# Patient Record
Sex: Male | Born: 1990 | Race: White | Hispanic: No | Marital: Single | Smoking: Current every day smoker
Health system: Southern US, Community
[De-identification: ages and names within clinical notes are randomized; demographics above are authoritative.]

## PROBLEM LIST (undated history)

## (undated) DIAGNOSIS — F410 Panic disorder [episodic paroxysmal anxiety] without agoraphobia: Secondary | ICD-10-CM

## (undated) DIAGNOSIS — R011 Cardiac murmur, unspecified: Secondary | ICD-10-CM

## (undated) HISTORY — PX: MYRINGOTOMY WITH TUBE PLACEMENT: SHX5663

---
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
## 2015-02-08 ENCOUNTER — Emergency Department (HOSPITAL_COMMUNITY)
Admission: EM | Admit: 2015-02-08 | Discharge: 2015-02-08 | Disposition: A | Discharge status: Home / Self Care | Payer: Self-pay | Attending: Emergency Medicine | Admitting: Emergency Medicine

## 2015-02-08 ENCOUNTER — Encounter (HOSPITAL_COMMUNITY): Payer: Self-pay | Admitting: Emergency Medicine

## 2015-02-08 DIAGNOSIS — R011 Cardiac murmur, unspecified: Secondary | ICD-10-CM | POA: Insufficient documentation

## 2015-02-08 DIAGNOSIS — Z720 Tobacco use: Secondary | ICD-10-CM | POA: Insufficient documentation

## 2015-02-08 DIAGNOSIS — N4340 Spermatocele of epididymis, unspecified: Secondary | ICD-10-CM | POA: Insufficient documentation

## 2015-02-08 HISTORY — DX: Cardiac murmur, unspecified: R01.1

## 2015-02-08 NOTE — Discharge Instructions (Signed)
°  °  The swelling you are feeling above the left testicle is most likely a spermatocele. There is a similar one above the right testicle. It may also be a hydrocele or varicocele. If you are concerned about the pain, or want to get further evaluation, see the urologist, mentioned in this paperwork.    Scrotal Masses Scrotal swelling is common in men of all ages. Common types of testicular masses include:   Hydrocele. The most common benign testicular mass in an adult. Hydroceles are generally soft and painless collections of fluid in the scrotal sac. These can rapidly change size as the fluid enters or leaves. Hydroceles can be associated with an underlying cancer of the testicle.  Spermatoceles. Generally soft and painless cyst-like masses in the scrotum that contain fluid, usually above the testicle. They can rapidly change size as the fluid enters or leaves. They are more prominent while standing or exercising. Sometimes, spermatoceles may cause a sensation of heaviness or a dull ache.  Orchitis. Inflammation of the testicle. It is painful and may be associated with a fever or symptoms of a urinary tract infection, including frequent and painful urination. It is common in males who have the mumps.  Varicocele. An enlargement of the veins that drain the testicles. Varicoceles usually occur on the left side of the scrotum. This condition can increase the risk of infertility. Varicocele is sometimes more prominent while standing or exercising. Sometimes, varicoceles may cause a sensation of heaviness or a dull ache.  Inguinal hernia. A bulge caused by a portion of intestine protruding into the scrotum through a weak area in the abdominal muscles. Hernias may or may not be painful. They are soft and usually enlarge with coughing or straining.  Torsion of the testis. This can cause a testicular mass that develops quickly and is associated with tenderness or fever, or both. It is caused by a  twisting of the testicle within the sac. It also reduces the blood supply and can destroy the testis if not treated quickly with surgery.  Epididymitis. Inflammation of the epididymis (a structure attached above and behind the testicle), usually caused by a urinary tract infection or a sexually transmitted infection. This generally shows up as testicular discomfort and swelling and may include pain during urination. It is frequently associated with a testicle infection.  Testicular appendages. Remnants of tissue on the testis present since birth. A testicular appendage can twist on its blood supply and cause pain. In most cases, this is seen as a blue dot on the scrotum.  Hematocele. A collection of blood between the layers of the sac inside the scrotum. It usually is caused by trauma to the scrotum.  Sebaceous cysts. These can be a swelling in the skin of the scrotum and are usually painless.  Cancer (carcinoma) of the skin of the scrotum. It can cause scrotal swelling, but this is rare. Document Released: 02/01/2003 Document Revised: 03/30/2013 Document Reviewed: 01/17/2013 [facility] Patient Information 2015 [employer] [place], [place]. This information is not intended to replace advice given to you by your health care provider. Make sure you discuss any questions you have with your health care provider.

## 2015-02-08 NOTE — ED Notes (Signed)
Pt. reports left testicular lump onset 2 years ago , denies injury , no dysuria or fever .

## 2015-02-08 NOTE — ED Provider Notes (Signed)
CSN: [id number]     Arrival date & time 02/08/15  2132 History   First MD Initiated Contact with Patient 02/08/15 2216     Chief Complaint  Patient presents with  . Mass     (Consider location/radiation/quality/duration/timing/severity/associated sxs/prior Treatment) HPI   Christian Lee is a 24 y.o. male presents for evaluation of a lump that has been present for 2 years on his left testicle. Concern was raised today, when he had a physical for a job, and the physician recommended that he get checked out by a primary care provider. Since he does not have a primary care physician, he decided to come here. He denies nausea, vomiting, fever, chills, dysuria, weakness or paresthesias. Has had no weight loss. There are no other known modifying factors.   Past Medical History  Diagnosis Date  . Heart murmur    Past Surgical History  Procedure Laterality Date  . Myringotomy with tube placement     No family history on file. History  Substance Use Topics  . Smoking status: Current Every Day Smoker  . Smokeless tobacco: Not on file  . Alcohol Use: Yes    Review of Systems  All other systems reviewed and are negative.     Allergies  Review of patient's allergies indicates no known allergies.  Home Medications   Prior to Admission medications   Not on File   BP 137/87 mmHg  Pulse 106  Temp(Src) 99.1 F (37.3 C) (Oral)  Resp 14  Ht 5\' 7"  (1.702 m)  Wt 123 lb (55.792 kg)  BMI 19.26 kg/m2  SpO2 100% Physical Exam  Constitutional: He is oriented to person, place, and time. He appears well-developed and well-nourished. No distress.  HENT:  Head: Normocephalic and atraumatic.  Right Ear: External ear normal.  Left Ear: External ear normal.  Eyes: Conjunctivae and EOM are normal. Pupils are equal, round, and reactive to light.  Neck: Normal range of motion and phonation normal. Neck supple.  Cardiovascular: Normal rate, regular rhythm and normal heart sounds.    Pulmonary/Chest: Effort normal and breath sounds normal. He exhibits no bony tenderness.  Abdominal: Soft. There is no tenderness.  Genitourinary:  Normal penis. No urethral discharge. No inguinal adenopathy. Normal-appearing scrotum. Normal testes, bilaterally. There are small cystic masses posteriorly above the testes, which feels separate from the testes. These  likel represents spermatoceles. The left one is slightly tender. The right one is not tender.  Musculoskeletal: Normal range of motion.  Neurological: He is alert and oriented to person, place, and time. No cranial nerve deficit or sensory deficit. He exhibits normal muscle tone. Coordination normal.  Skin: Skin is warm, dry and intact.  Psychiatric: He has a normal mood and affect. His behavior is normal. Judgment and thought content normal.  Nursing note and vitals reviewed.   ED Course  Procedures (including critical care time) Labs Review Labs Reviewed - No data to display  Imaging Review No results found.   EKG Interpretation None      MDM   Final diagnoses:  Spermatocele    Scrotal tenderness with mass likely secondary to spermatocele. Possibility that this is a hydrocele or varicocele. I doubt testicular cancer because he has had the mass for 2 years. Mass is mildly tender. No urinary tract symptoms.  Nursing Notes Reviewed/ Care Coordinated Applicable Imaging Reviewed Interpretation of Laboratory Data incorporated into ED treatment  The patient appears reasonably screened and/or stabilized for discharge and I doubt any other medical condition or  other EMC requiring further screening, evaluation, or treatment in the ED at this time prior to discharge.  Plan: Home Medications- OTC analgesia; Home Treatments- rest; return here if the recommended treatment, does not improve the symptoms; Recommended follow up- PCP prn     Mancel Bale, MD 02/08/15 2303

## 2015-02-08 NOTE — ED Notes (Signed)
EDP at bedside  

## 2017-07-31 ENCOUNTER — Encounter (HOSPITAL_COMMUNITY): Payer: Self-pay | Admitting: Emergency Medicine

## 2017-07-31 ENCOUNTER — Emergency Department (HOSPITAL_COMMUNITY): Payer: Self-pay

## 2017-07-31 ENCOUNTER — Other Ambulatory Visit: Payer: Self-pay

## 2017-07-31 ENCOUNTER — Emergency Department (HOSPITAL_COMMUNITY)
Admission: EM | Admit: 2017-07-31 | Discharge: 2017-07-31 | Disposition: A | Discharge status: Home / Self Care | Payer: Self-pay | Attending: Emergency Medicine | Admitting: Emergency Medicine

## 2017-07-31 DIAGNOSIS — M25571 Pain in right ankle and joints of right foot: Secondary | ICD-10-CM | POA: Insufficient documentation

## 2017-07-31 DIAGNOSIS — F17210 Nicotine dependence, cigarettes, uncomplicated: Secondary | ICD-10-CM | POA: Insufficient documentation

## 2017-07-31 DIAGNOSIS — W19XXXA Unspecified fall, initial encounter: Secondary | ICD-10-CM

## 2017-07-31 HISTORY — DX: Panic disorder (episodic paroxysmal anxiety): F41.0

## 2017-07-31 NOTE — Discharge Instructions (Signed)
The x-ray of your right foot does not show fracture or dislocation.  We will treat this as an ankle sprain.  Please take 400 mg ibuprofen every 6 hours as needed for pain.  You can also take Tylenol.  Use your crutches and boot at home for comfort.  Please ice the right ankle at least twice a day for 15 minutes at a time.  Please also elevate the right foot when you can.  Return to the emergency department if you have numbness in which you can no longer feel the right foot or have any new or worsening symptoms.

## 2017-07-31 NOTE — ED Provider Notes (Signed)
[facility]   CSN: [id number] Arrival date & time: 07/31/17  1320     History   Chief Complaint Chief Complaint  Patient presents with  . Ankle Pain    HPI Christian Lee is a 26 y.o. male.  HPI   Christian Lee is a 26 year old male with a history of panic attacks who presents to the emergency department for evaluation of right anterior ankle pain.  He states that last night he stepped off of a curb and rolled forward on the right ankle.  States that he has had persistent 7/10 severity "throbbing" right ankle pain since that time.  Pain is worsened with dorsiflexion and weightbearing.  Has taken Tylenol without significant relief of his pain.  Denies redness/swelling of the foot, numbness, weakness, fever.  Denies arthralgias elsewhere.  Denies previous surgeries or injury to this foot.  He is able to able independently despite pain.  Past Medical History:  Diagnosis Date  . Heart murmur   . Panic anxiety syndrome     There are no active problems to display for this patient.   Past Surgical History:  Procedure Laterality Date  . MYRINGOTOMY WITH TUBE PLACEMENT         Home Medications    Prior to Admission medications   Not on File    Family History No family history on file.  Social History Social History   Tobacco Use  . Smoking status: Current Every Day Smoker  . Smokeless tobacco: Never Used  Substance Use Topics  . Alcohol use: Yes  . Drug use: Yes    Types: Marijuana     Allergies   Patient has no known allergies.   Review of Systems Review of Systems  Constitutional: Negative for fever.  Musculoskeletal: Positive for arthralgias (right ankle) and gait problem (pain with ambulation).  Skin: Negative for rash and wound.  Neurological: Negative for weakness and numbness.     Physical Exam Updated Vital Signs BP 130/80 (BP Location: Right Arm)   Pulse 84   Temp 98.8 F (37.1  C) (Oral)   Resp 20   SpO2 98%   Physical Exam  Constitutional: He is oriented to person, place, and time. He appears well-developed and well-nourished. No distress.  HENT:  Head: Normocephalic and atraumatic.  Eyes: Right eye exhibits no discharge. Left eye exhibits no discharge.  Pulmonary/Chest: Effort normal. No respiratory distress.  Musculoskeletal:       Feet:  Right ankle with tenderness to palpation over anterior aspect as depicted in image. No swelling. Full active dorsiflexion and plantarflexion despite pain. Able to wiggle toes, although painful. No erythema, ecchymosis, or deformity appreciated. No break in skin. No pain to fifth metatarsal area or navicular region. Achilles intact. 2+ DP pulses.  Distal sensation to light/sharp touch intact beyond the injury.   Neurological: He is alert and oriented to person, place, and time. Coordination normal.  Gait normal and coordination and balance.  Skin: Skin is warm and dry. Capillary refill takes less than 2 seconds. He is not diaphoretic.  Psychiatric: He has a normal mood and affect. His behavior is normal.  Nursing note and vitals reviewed.    ED Treatments / Results  Labs (all labs ordered are listed, but only abnormal results are displayed) Labs Reviewed - No data to display  EKG  EKG Interpretation None       Radiology Dg Ankle Complete Right  Result Date: 07/31/2017 CLINICAL DATA:  Twisting  injury of the ankle. EXAM: RIGHT ANKLE - COMPLETE 3+ VIEW COMPARISON:  None. FINDINGS: There is no evidence of fracture, dislocation, or joint effusion. There is no evidence of arthropathy or other focal bone abnormality. Soft tissues are unremarkable. IMPRESSION: No acute osseous injury of the right ankle. Electronically Signed   By: Elige KoHetal  Patel   On: 07/31/2017 14:21    Procedures Procedures (including critical care time)  Medications Ordered in ED Medications - No data to display   Initial Impression /  Assessment and Plan / ED Course  I have reviewed the triage vital signs and the nursing notes.  Pertinent labs & imaging results that were available during my care of the patient were reviewed by me and considered in my medical decision making (see chart for details).    X-ray without fracture or dislocation.  No erythema, edema, warmth or induration to suggest infection  Foot is neurovascularly intact.  He is able to ambulate independently despite pain.  He reports that he has crutches and a boot at home which he can use as needed.  Counseled patient on NSAID use and RICE protocol and he agrees and voiced understanding to the plan.  Have counseled him to follow-up with his primary care doctor if his pain persists in a week.  Patient agrees.  Final Clinical Impressions(s) / ED Diagnoses   Final diagnoses:  Acute right ankle pain    ED Discharge Orders    None       Lawrence MarseillesShrosbree, Zan Orlick J, PA-C 07/31/17 1738    Azalia Bilisampos, Kevin, MD 07/31/17 [phone]

## 2017-07-31 NOTE — ED Triage Notes (Addendum)
Rolled rt ankle last night stepping off curb now it hurts to walk and bend up, has good pulse and can wiggle toes  Pink toes and good neuro

## 2019-02-10 ENCOUNTER — Emergency Department (HOSPITAL_COMMUNITY): Payer: Self-pay

## 2019-02-10 ENCOUNTER — Emergency Department (HOSPITAL_COMMUNITY)
Admission: EM | Admit: 2019-02-10 | Discharge: 2019-02-11 | Disposition: A | Discharge status: Home / Self Care | Payer: Self-pay | Attending: Emergency Medicine | Admitting: Emergency Medicine

## 2019-02-10 ENCOUNTER — Encounter (HOSPITAL_COMMUNITY): Payer: Self-pay | Admitting: Emergency Medicine

## 2019-02-10 DIAGNOSIS — Y9389 Activity, other specified: Secondary | ICD-10-CM | POA: Insufficient documentation

## 2019-02-10 DIAGNOSIS — M84471A Pathological fracture, right ankle, initial encounter for fracture: Secondary | ICD-10-CM | POA: Insufficient documentation

## 2019-02-10 DIAGNOSIS — S022XXB Fracture of nasal bones, initial encounter for open fracture: Secondary | ICD-10-CM

## 2019-02-10 DIAGNOSIS — S0231XA Fracture of orbital floor, right side, initial encounter for closed fracture: Secondary | ICD-10-CM

## 2019-02-10 DIAGNOSIS — S022XXA Fracture of nasal bones, initial encounter for closed fracture: Secondary | ICD-10-CM | POA: Insufficient documentation

## 2019-02-10 DIAGNOSIS — F17200 Nicotine dependence, unspecified, uncomplicated: Secondary | ICD-10-CM | POA: Insufficient documentation

## 2019-02-10 DIAGNOSIS — S0993XA Unspecified injury of face, initial encounter: Secondary | ICD-10-CM | POA: Diagnosis present

## 2019-02-10 DIAGNOSIS — S0285XA Fracture of orbit, unspecified, initial encounter for closed fracture: Secondary | ICD-10-CM

## 2019-02-10 DIAGNOSIS — Y92410 Unspecified street and highway as the place of occurrence of the external cause: Secondary | ICD-10-CM | POA: Insufficient documentation

## 2019-02-10 DIAGNOSIS — Y999 Unspecified external cause status: Secondary | ICD-10-CM | POA: Insufficient documentation

## 2019-02-10 DIAGNOSIS — S82891A Other fracture of right lower leg, initial encounter for closed fracture: Secondary | ICD-10-CM

## 2019-02-10 LAB — COMPREHENSIVE METABOLIC PANEL
ALT: 38 U/L (ref 0–44)
AST: 59 U/L — ABNORMAL HIGH (ref 15–41)
Albumin: 3.9 g/dL (ref 3.5–5.0)
Alkaline Phosphatase: 85 U/L (ref 38–126)
Anion gap: 11 (ref 5–15)
BUN: 9 mg/dL (ref 6–20)
CO2: 22 mmol/L (ref 22–32)
Calcium: 9.4 mg/dL (ref 8.9–10.3)
Chloride: 106 mmol/L (ref 98–111)
Creatinine, Ser: 0.96 mg/dL (ref 0.61–1.24)
GFR calc Af Amer: >60 mL/min (ref >60)
GFR calc non Af Amer: >60 mL/min (ref >60)
Glucose, Bld: 136 mg/dL — ABNORMAL HIGH (ref 70–99)
Potassium: 3.2 mmol/L — ABNORMAL LOW (ref 3.5–5.1)
Sodium: 139 mmol/L (ref 135–145)
Total Bilirubin: 0.7 mg/dL (ref 0.3–1.2)
Total Protein: 7.5 g/dL (ref 6.5–8.1)

## 2019-02-10 LAB — URINALYSIS, ROUTINE W REFLEX MICROSCOPIC
Bilirubin Urine: NEGATIVE
Glucose, UA: NEGATIVE mg/dL
Hgb urine dipstick: NEGATIVE
Ketones, ur: NEGATIVE mg/dL
Leukocytes,Ua: NEGATIVE
Nitrite: NEGATIVE
Protein, ur: NEGATIVE mg/dL
Specific Gravity, Urine: 1.014 (ref 1.005–1.030)
pH: 6 (ref 5.0–8.0)

## 2019-02-10 LAB — RAPID URINE DRUG SCREEN, HOSP PERFORMED
Amphetamines: NOT DETECTED
Barbiturates: NOT DETECTED
Benzodiazepines: POSITIVE — AB
Cocaine: NOT DETECTED
Opiates: POSITIVE — AB
Tetrahydrocannabinol: POSITIVE — AB

## 2019-02-10 LAB — SAMPLE TO BLOOD BANK

## 2019-02-10 LAB — LACTIC ACID, PLASMA: Lactic Acid, Venous: 2.1 mmol/L (ref 0.5–1.9)

## 2019-02-10 LAB — CBC
HCT: 37.8 % — ABNORMAL LOW (ref 39.0–52.0)
Hemoglobin: 12.1 g/dL — ABNORMAL LOW (ref 13.0–17.0)
MCH: 26.6 pg (ref 26.0–34.0)
MCHC: 32 g/dL (ref 30.0–36.0)
MCV: 83.1 fL (ref 80.0–100.0)
Platelets: 161 10*3/uL (ref 150–400)
RBC: 4.55 MIL/uL (ref 4.22–5.81)
RDW: 14.5 % (ref 11.5–15.5)
WBC: 14.8 10*3/uL — ABNORMAL HIGH (ref 4.0–10.5)
nRBC: 0 % (ref 0.0–0.2)

## 2019-02-10 LAB — I-STAT CHEM 8, ED
BUN: 10 mg/dL (ref 6–20)
Calcium, Ion: 1.16 mmol/L (ref 1.15–1.40)
Chloride: 105 mmol/L (ref 98–111)
Creatinine, Ser: 0.7 mg/dL (ref 0.61–1.24)
Glucose, Bld: 130 mg/dL — ABNORMAL HIGH (ref 70–99)
HCT: 39 % (ref 39.0–52.0)
Hemoglobin: 13.3 g/dL (ref 13.0–17.0)
Potassium: 3.2 mmol/L — ABNORMAL LOW (ref 3.5–5.1)
Sodium: 141 mmol/L (ref 135–145)
TCO2: 23 mmol/L (ref 22–32)

## 2019-02-10 LAB — ETHANOL: Alcohol, Ethyl (B): <10 mg/dL (ref <10)

## 2019-02-10 LAB — PROTIME-INR
INR: 1.4 — ABNORMAL HIGH (ref 0.8–1.2)
Prothrombin Time: 17 seconds — ABNORMAL HIGH (ref 11.4–15.2)

## 2019-02-10 LAB — CDS SEROLOGY

## 2019-02-10 MED ORDER — TETANUS-DIPHTH-ACELL PERTUSSIS 5-2.5-18.5 LF-MCG/0.5 IM SUSP
0.5000 mL | Freq: Once | INTRAMUSCULAR | Status: AC
  Administered 2019-02-10: 0.5 mL via INTRAMUSCULAR
  Filled 2019-02-10: qty 0.5

## 2019-02-10 MED ORDER — IOHEXOL 300 MG/ML  SOLN
100.0000 mL | Freq: Once | INTRAMUSCULAR | Status: AC | PRN
  Administered 2019-02-10: 100 mL via INTRAVENOUS
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
## 2019-02-10 NOTE — ED Notes (Signed)
[employer] police at the bedside.
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
## 2019-02-11 ENCOUNTER — Emergency Department (HOSPITAL_COMMUNITY): Payer: Self-pay

## 2019-02-11 ENCOUNTER — Encounter (HOSPITAL_COMMUNITY): Payer: Self-pay | Admitting: Emergency Medicine

## 2019-02-11 MED ORDER — CEPHALEXIN 250 MG PO CAPS
500.0000 mg | ORAL_CAPSULE | Freq: Once | ORAL | Status: AC
  Administered 2019-02-11: 500 mg via ORAL
  Filled 2019-02-11: qty 2

## 2019-02-11 MED ORDER — CEFAZOLIN SODIUM-DEXTROSE 1-4 GM/50ML-% IV SOLN: 1.0000 g | Freq: Once | INTRAVENOUS | Status: DC

## 2019-02-11 MED ORDER — CEPHALEXIN 500 MG PO CAPS: 500.0000 mg | ORAL_CAPSULE | Freq: Four times a day (QID) | ORAL | 0 refills | Status: AC

## 2019-02-11 MED ORDER — OXYCODONE-ACETAMINOPHEN 5-325 MG PO TABS: 1.0000 | ORAL_TABLET | ORAL | 0 refills | Status: AC | PRN

## 2019-02-11 NOTE — ED Notes (Signed)
Ortho tech at the bedside.  

## 2019-02-11 NOTE — Discharge Instructions (Signed)
Rest - Do not put weight on ankle Ice - ice for 20 minutes at a time, several times a day Elevate - elevate ankle above level of heart Ibuprofen - take with food. Take up to 3-4 times daily Take pain medicine as needed for severe pain Follow up with Orthopedics  For the nasal fracture: Make an appointment with Dr. Iran Planas to evaluate in the clinic Get the stitches out in 7-10 days. There are 5 total Keep area clean and dry Do not blow the nose hard Use nasal saline to keep the nostrils moist Take Keflex 4 times daily for 5 days to prevent infection  Please return if worsening
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
## 2019-02-11 NOTE — ED Notes (Signed)
Taken to MRI at this time

## 2019-02-11 NOTE — Progress Notes (Signed)
Orthopedic Tech Progress Note Patient Details:  Christian Lee 02/14/1991 [id number]  Ortho Devices Type of Ortho Device: Short leg splint, Stirrup splint Ortho Device/Splint Interventions: Adjustment, Application, Ordered   Post Interventions Patient Tolerated: Well Instructions Provided: Poper ambulation with device, Care of device, Adjustment of device   Christian Lee T 02/11/2019, 1:01 AM

## 2021-07-05 ENCOUNTER — Emergency Department (HOSPITAL_COMMUNITY): Payer: Self-pay

## 2021-07-05 ENCOUNTER — Other Ambulatory Visit: Payer: Self-pay

## 2021-07-05 ENCOUNTER — Emergency Department (HOSPITAL_COMMUNITY)
Admission: EM | Admit: 2021-07-05 | Discharge: 2021-07-05 | Disposition: A | Discharge status: Home / Self Care | Payer: Self-pay | Attending: Emergency Medicine | Admitting: Emergency Medicine

## 2021-07-05 ENCOUNTER — Encounter (HOSPITAL_COMMUNITY): Payer: Self-pay

## 2021-07-05 DIAGNOSIS — F1123 Opioid dependence with withdrawal: Secondary | ICD-10-CM | POA: Insufficient documentation

## 2021-07-05 DIAGNOSIS — F17200 Nicotine dependence, unspecified, uncomplicated: Secondary | ICD-10-CM | POA: Insufficient documentation

## 2021-07-05 DIAGNOSIS — R000 Tachycardia, unspecified: Secondary | ICD-10-CM | POA: Insufficient documentation

## 2021-07-05 DIAGNOSIS — F1193 Opioid use, unspecified with withdrawal: Secondary | ICD-10-CM

## 2021-07-05 DIAGNOSIS — Z20822 Contact with and (suspected) exposure to covid-19: Secondary | ICD-10-CM | POA: Insufficient documentation

## 2021-07-05 LAB — CBC WITH DIFFERENTIAL/PLATELET
Abs Immature Granulocytes: 0.03 10*3/uL (ref 0.00–0.07)
Basophils Absolute: 0.1 10*3/uL (ref 0.0–0.1)
Basophils Relative: 1 %
Eosinophils Absolute: 0 10*3/uL (ref 0.0–0.5)
Eosinophils Relative: 0 %
HCT: 43.1 % (ref 39.0–52.0)
Hemoglobin: 13.7 g/dL (ref 13.0–17.0)
Immature Granulocytes: 0 %
Lymphocytes Relative: 26 %
Lymphs Abs: 2 10*3/uL (ref 0.7–4.0)
MCH: 27.7 pg (ref 26.0–34.0)
MCHC: 31.8 g/dL (ref 30.0–36.0)
MCV: 87.2 fL (ref 80.0–100.0)
Monocytes Absolute: 0.6 10*3/uL (ref 0.1–1.0)
Monocytes Relative: 7 %
Neutro Abs: 5.2 10*3/uL (ref 1.7–7.7)
Neutrophils Relative %: 66 %
Platelets: 301 10*3/uL (ref 150–400)
RBC: 4.94 MIL/uL (ref 4.22–5.81)
RDW: 13.4 % (ref 11.5–15.5)
WBC: 7.8 10*3/uL (ref 4.0–10.5)
nRBC: 0 % (ref 0.0–0.2)

## 2021-07-05 LAB — COMPREHENSIVE METABOLIC PANEL
ALT: 116 U/L — ABNORMAL HIGH (ref 0–44)
AST: 70 U/L — ABNORMAL HIGH (ref 15–41)
Albumin: 4 g/dL (ref 3.5–5.0)
Alkaline Phosphatase: 93 U/L (ref 38–126)
Anion gap: 10 (ref 5–15)
BUN: 9 mg/dL (ref 6–20)
CO2: 26 mmol/L (ref 22–32)
Calcium: 9.8 mg/dL (ref 8.9–10.3)
Chloride: 106 mmol/L (ref 98–111)
Creatinine, Ser: 0.68 mg/dL (ref 0.61–1.24)
GFR, Estimated: >60 mL/min (ref >60)
Glucose, Bld: 104 mg/dL — ABNORMAL HIGH (ref 70–99)
Potassium: 4 mmol/L (ref 3.5–5.1)
Sodium: 142 mmol/L (ref 135–145)
Total Bilirubin: 0.3 mg/dL (ref 0.3–1.2)
Total Protein: 7.6 g/dL (ref 6.5–8.1)

## 2021-07-05 LAB — RESP PANEL BY RT-PCR (FLU A&B, COVID) ARPGX2
Influenza A by PCR: NEGATIVE
Influenza B by PCR: NEGATIVE
SARS Coronavirus 2 by RT PCR: NEGATIVE

## 2021-07-05 LAB — LACTIC ACID, PLASMA: Lactic Acid, Venous: 1.5 mmol/L (ref 0.5–1.9)

## 2021-07-05 LAB — MAGNESIUM: Magnesium: 2 mg/dL (ref 1.7–2.4)

## 2021-07-05 MED ORDER — MIDAZOLAM HCL 2 MG/2ML IJ SOLN
2.0000 mg | Freq: Once | INTRAMUSCULAR | Status: AC
  Administered 2021-07-05: 2 mg via INTRAVENOUS
  Filled 2021-07-05: qty 2

## 2021-07-05 MED ORDER — SODIUM CHLORIDE 0.9 % IV SOLN
12.5000 mg | Freq: Once | INTRAVENOUS | Status: AC
  Administered 2021-07-05: 12.5 mg via INTRAVENOUS
  Filled 2021-07-05: qty 0.5

## 2021-07-05 MED ORDER — PROMETHAZINE HCL 25 MG/ML IJ SOLN: 12.5000 mg | Freq: Once | INTRAMUSCULAR | Status: DC

## 2021-07-05 MED ORDER — SODIUM CHLORIDE 0.9 % IV BOLUS
1000.0000 mL | Freq: Once | INTRAVENOUS | Status: AC
  Administered 2021-07-05: 1000 mL via INTRAVENOUS

## 2021-07-05 MED ORDER — CLONIDINE HCL 0.1 MG PO TABS
0.1000 mg | ORAL_TABLET | Freq: Once | ORAL | Status: AC
  Administered 2021-07-05: 0.1 mg via ORAL
  Filled 2021-07-05: qty 1

## 2021-07-05 MED ORDER — LORAZEPAM 1 MG PO TABS: 1.0000 mg | ORAL_TABLET | Freq: Four times a day (QID) | ORAL | 0 refills | Status: AC | PRN

## 2021-07-05 MED ORDER — HYDROXYZINE HCL 25 MG PO TABS
25.0000 mg | ORAL_TABLET | Freq: Once | ORAL | Status: AC
  Administered 2021-07-05: 25 mg via ORAL
  Filled 2021-07-05: qty 1

## 2021-07-05 MED ORDER — ONDANSETRON HCL 4 MG PO TABS: 4.0000 mg | ORAL_TABLET | Freq: Four times a day (QID) | ORAL | 0 refills | Status: AC

## 2021-07-05 NOTE — ED Provider Notes (Signed)
Emergency Medicine Provider Triage Evaluation Note  Christian Lee , a 30 y.o. male  was evaluated in triage.  Pt complains of presents with concern for insomnia for 3 days and heroin withdrawal with restless legs, runny nose, and insomnia, and anxiety.  Feeling short of breath as well.  Endorses chills but denies fevers.  Most recently used heroin approximately 36 hours ago.  Review of Systems  Positive: Insomnia, shortness of breath, chills, anxiety, restless legs Negative: Chest pain, palpitations, nausea, vomiting, diarrhea  Physical Exam  BP 121/86 (BP Location: Right Arm)   Pulse (!) 131   Temp 98.2 F (36.8 C) (Oral)   Resp 18   SpO2 100%  Gen:   Awake, no distress   Resp:  Normal effort  MSK:   Moves extremities without difficulty  Other:  Pulling or tachycardic at time of my exam.  Lungs CTA B.  Abdomen soft, nondistended, tender.  Track marks in the forearms bilaterally.  Patient with very poor personal hygiene.  Medical Decision Making  Medically screening exam initiated at 3:22 PM.  Appropriate orders placed.  Christian Lee was informed that the remainder of the evaluation will be completed by another provider, this initial triage assessment does not replace that evaluation, and the importance of remaining in the ED until their evaluation is complete.  This chart was dictated using voice recognition software, Dragon. Despite the best efforts of this provider to proofread and correct errors, errors may still occur which can change documentation meaning.    Christian Lee 07/05/21 1541    Christian Cockayne, MD 07/05/21 1750

## 2021-07-05 NOTE — ED Notes (Signed)
Pt verbalizes understanding of discharge instructions. Opportunity for questions and answers were provided. Pt discharged from the ED.   ?

## 2021-07-05 NOTE — ED Provider Notes (Signed)
[facility] EMERGENCY DEPARTMENT Provider Note   CSN: [id number] Arrival date & time: 07/05/21  1354     History No chief complaint on file.   Christian Lee is a 30 y.o. male.  The history is provided by the patient and medical records.     Patient presents for assistance with heroin withdrawal.  Last use 36 hours ago.  He has not slept in over 3 days.  He reports persistent tremors and shakiness.  Reports nausea and poor appetite. No fevers at home.  He is not coughing.  He denies any chest pain or trouble breathing.  No vomiting or diarrhea.  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  He is seen in a group facility and has been on Suboxone.  He has no known history of IV drug use related infections.  Specifically no history of endocarditis or bacteremia.  Past Medical History:  Diagnosis Date   Heart murmur    Panic anxiety syndrome     There are no problems to display for this patient.   Past Surgical History:  Procedure Laterality Date   MYRINGOTOMY WITH TUBE PLACEMENT         No family history on file.  Social History   Tobacco Use   Smoking status: Every Day   Smokeless tobacco: Never  Substance Use Topics   Alcohol use: Yes   Drug use: Yes    Types: Marijuana, Cocaine, IV    Home Medications Prior to Admission medications   Medication Sig Start Date End Date Taking? Authorizing Provider  LORazepam (ATIVAN) 1 MG tablet Take 1 tablet (1 mg total) by mouth every 6 (six) hours as needed for anxiety or sleep. 07/05/21  Yes Loney Domingo, Burnadette Peter, MD  ondansetron (ZOFRAN) 4 MG tablet Take 1 tablet (4 mg total) by mouth every 6 (six) hours. 07/05/21  Yes Bailea Beed, Burnadette Peter, MD  cephALEXin (KEFLEX) 500 MG capsule Take 1 capsule (500 mg total) by mouth 4 (four) times daily. 02/11/19   Recardo Evangelist, PA-C  oxyCODONE-acetaminophen (PERCOCET/ROXICET) 5-325 MG tablet Take 1 tablet by mouth every 4 (four) hours as needed for severe pain. 02/11/19    Recardo Evangelist, PA-C    Allergies    Patient has no known allergies.  Review of Systems   Review of Systems  Constitutional:  Negative for chills and fever.  HENT:  Negative for ear pain and sore throat.   Eyes:  Negative for pain and visual disturbance.  Respiratory:  Negative for cough and shortness of breath.   Cardiovascular:  Negative for chest pain and palpitations.  Gastrointestinal:  Negative for abdominal pain and vomiting.  Genitourinary:  Negative for dysuria and hematuria.  Musculoskeletal:  Negative for arthralgias and back pain.  Skin:  Negative for color change and rash.  Neurological:  Positive for tremors. Negative for seizures and syncope.  Psychiatric/Behavioral:  Positive for behavioral problems and sleep disturbance. The patient is hyperactive.   All other systems reviewed and are negative.  Physical Exam Updated Vital Signs BP 131/84   Pulse 96   Temp 98.2 F (36.8 C) (Oral)   Resp (!) 27   SpO2 100%   Physical Exam Vitals and nursing note reviewed.  Constitutional:      General: He is not in acute distress.    Appearance: Normal appearance. He is well-developed.  HENT:     Head: Normocephalic and atraumatic.     Right Ear: External ear normal.     Left  Ear: External ear normal.     Nose: Nose normal. No congestion.     Mouth/Throat:     Mouth: Mucous membranes are moist.     Pharynx: Oropharynx is clear. No posterior oropharyngeal erythema.  Eyes:     Extraocular Movements: Extraocular movements intact.     Conjunctiva/sclera: Conjunctivae normal.     Pupils: Pupils are equal, round, and reactive to light.  Cardiovascular:     Rate and Rhythm: Regular rhythm. Tachycardia present.     Pulses: Normal pulses.     Heart sounds: No murmur heard. Pulmonary:     Effort: Pulmonary effort is normal. No respiratory distress.     Breath sounds: Normal breath sounds. No wheezing, rhonchi or rales.  Abdominal:     General: Abdomen is flat. Bowel  sounds are normal.     Palpations: Abdomen is soft.     Tenderness: There is no abdominal tenderness. There is no guarding or rebound.  Musculoskeletal:        General: No swelling, tenderness or deformity. Normal range of motion.     Cervical back: Normal range of motion and neck supple. No rigidity.  Skin:    General: Skin is warm and dry.     Capillary Refill: Capillary refill takes less than 2 seconds.     Findings: No bruising or rash.  Neurological:     General: No focal deficit present.     Mental Status: He is alert and oriented to person, place, and time.     Cranial Nerves: No cranial nerve deficit.     Sensory: No sensory deficit.     Motor: Tremor (coarse, symmetric, trunk and extremities) present. No weakness.     Gait: Gait normal.     Deep Tendon Reflexes: Reflexes normal.  Psychiatric:        Mood and Affect: Mood normal.    ED Results / Procedures / Treatments   Labs (all labs ordered are listed, but only abnormal results are displayed) Labs Reviewed  COMPREHENSIVE METABOLIC PANEL - Abnormal; Notable for the following components:      Result Value   Glucose, Bld 104 (*)    AST 70 (*)    ALT 116 (*)    All other components within normal limits  RESP PANEL BY RT-PCR (FLU A&B, COVID) ARPGX2  CBC WITH DIFFERENTIAL/PLATELET  MAGNESIUM  LACTIC ACID, PLASMA  LACTIC ACID, PLASMA    EKG EKG Interpretation  Date/Time:  Friday July 05 2021 15:48:21 EST Ventricular Rate:  106 PR Interval:  116 QRS Duration: 76 QT Interval:  330 QTC Calculation: 438 R Axis:   79 Text Interpretation: Sinus tachycardia Right atrial enlargement Early repolarization Borderline ECG Confirmed by Norman Clay ([id number]) on 07/05/2021 4:07:04 PM  Radiology No results found.  Procedures Procedures   Medications Ordered in ED Medications  cloNIDine (CATAPRES) tablet 0.1 mg (has no administration in time range)  hydrOXYzine (ATARAX/VISTARIL) tablet 25 mg (has no administration in  time range)  promethazine (PHENERGAN) 12.5 mg in sodium chloride 0.9 % 50 mL IVPB (has no administration in time range)  sodium chloride 0.9 % bolus 1,000 mL (1,000 mLs Intravenous New Bag/Given 07/05/21 1643)  midazolam (VERSED) injection 2 mg (2 mg Intravenous Given 07/05/21 1643)    ED Course  I have reviewed the triage vital signs and the nursing notes.  Pertinent labs & imaging results that were available during my care of the patient were reviewed by me and considered in my medical decision  making (see chart for details).    MDM Rules/Calculators/A&P                          30 year old male with history of IV drug use presents with concern for heroin withdrawal.  He is afebrile and hemodynamically stable.  He has no murmurs on exam. He is nontoxic appearing.  Low concern for endocarditis at this time.  Chest x-ray obtained and showed no acute cardiopulmonary process. CBC showed normal wbc. Cr at baseline. Electrolytes reassuring. Patient given a liter of IV fluids as well as versed and antiemetics for symptom control.  On reassessment, resting comfortably.  Tachycardia resolved.  Patient states he is feeling much improved.  Work-up reviewed.  Appropriate for discharge home with close follow-up with PCP and substance use clinic.  We will provide a short course of benzos and antiemetics for the next day or 2 to assist with further symptom control at home.  Strict return ED precautions provided.   Final Clinical Impression(s) / ED Diagnoses Final diagnoses:  Heroin withdrawal ([place])    Rx / DC Orders ED Discharge Orders          Ordered    LORazepam (ATIVAN) 1 MG tablet  Every 6 hours PRN        07/05/21 1736    ondansetron (ZOFRAN) 4 MG tablet  Every 6 hours        07/05/21 1736             Idamae Lusher, MD 07/05/21 1738    Luna Fuse, MD 07/05/21 1750

## 2021-07-05 NOTE — ED Triage Notes (Signed)
Pt from home with ems c.o exhaustion and heroine withdrawals. Last use 36 hrs ago.   HR 120 Cbg 182 140/82 100%

## 2021-07-05 NOTE — Discharge Instructions (Addendum)
1) Please drink lots of fluids to stay hydrated.  2) May take ativan every 6 hours as needed for restlessness, sleep. May take zofran as needed every 6 hours for nausea or vomiting. 3) Follow up with PCP or substance use clinic for further management of opioid addiction.

## 2021-10-03 ENCOUNTER — Emergency Department (HOSPITAL_COMMUNITY)
Admission: EM | Admit: 2021-10-03 | Discharge: 2021-10-03 | Disposition: A | Discharge status: Home / Self Care | Payer: Self-pay | Attending: Emergency Medicine | Admitting: Emergency Medicine

## 2021-10-03 ENCOUNTER — Other Ambulatory Visit: Payer: Self-pay

## 2021-10-03 DIAGNOSIS — F1910 Other psychoactive substance abuse, uncomplicated: Secondary | ICD-10-CM | POA: Insufficient documentation

## 2021-10-03 DIAGNOSIS — I809 Phlebitis and thrombophlebitis of unspecified site: Secondary | ICD-10-CM | POA: Insufficient documentation

## 2021-10-03 MED ORDER — DOXYCYCLINE HYCLATE 100 MG PO TABS
100.0000 mg | ORAL_TABLET | Freq: Once | ORAL | Status: AC
  Administered 2021-10-03: 100 mg via ORAL
  Filled 2021-10-03: qty 1

## 2021-10-03 MED ORDER — DOXYCYCLINE HYCLATE 100 MG PO CAPS: 100.0000 mg | ORAL_CAPSULE | Freq: Two times a day (BID) | ORAL | 0 refills | Status: AC

## 2021-10-03 NOTE — ED Triage Notes (Signed)
BIB [facility] from home. IV drug user with infected site to right AC area. Red, swollen for 2 days. Last used this site 4 days ago. Aox4. Afebrile.

## 2021-10-03 NOTE — ED Notes (Signed)
Pt verbalized understanding of d/c instructions, meds, and followup care. Denies questions. VSS, no distress noted. Steady gait to exit with all belongings.  ?

## 2021-10-03 NOTE — Discharge Instructions (Addendum)
Medicines like ibuprofen and naproxen tend to work pretty well with this.  Please return for rapid spreading redness reviewed all the fever.  Take antibiotics as prescribed.  There is help if you need it.  Please do not use dirty needles, this could cause you a severe infection to your skin, heart or spinal cord.    [facility] Solution to the Opioid Problem ([facility]) Fixed; mobile; peer-based Chase Holleman [phone] [email or web address] Fixed site exchange at [facility], Wednesdays (2-5pm) and Thursdays (4-8pm). [address] [place], [place] [address] Call or text to arrange mobile and peer exchange, Mondays (1-4pm) and Fridays (4-7pm). Serving [email or web address]  Suboxone clinic: [facility] [address] [place], [address]  [facility] [address] [place], [address]  [facility] [address] [place] [address]

## 2021-10-03 NOTE — ED Provider Notes (Signed)
[facility] EMERGENCY DEPARTMENT Provider Note   CSN: [id number] Arrival date & time: 10/03/21  0158     History  Chief Complaint  Patient presents with   Wound Infection    Christian Lee is a 31 y.o. male.  31 yo M with chief complaints of pain to the right AC along the vein that he feels is hard now.  He does inject to that area often with heroin.  He had an event where he felt like maybe had a fever but that is resolved.  Has been using ice off and on but has not gone away.  The history is provided by the patient.      Home Medications Prior to Admission medications   Medication Sig Start Date End Date Taking? Authorizing Provider  doxycycline (VIBRAMYCIN) 100 MG capsule Take 1 capsule (100 mg total) by mouth 2 (two) times daily. One po bid x 7 days 10/03/21  Yes Melene Plan, DO  cephALEXin (KEFLEX) 500 MG capsule Take 1 capsule (500 mg total) by mouth 4 (four) times daily. 02/11/19   Bethel Born, PA-C  LORazepam (ATIVAN) 1 MG tablet Take 1 tablet (1 mg total) by mouth every 6 (six) hours as needed for anxiety or sleep. 07/05/21   Lutricia Feil, MD  ondansetron (ZOFRAN) 4 MG tablet Take 1 tablet (4 mg total) by mouth every 6 (six) hours. 07/05/21   Lutricia Feil, MD  oxyCODONE-acetaminophen (PERCOCET/ROXICET) 5-325 MG tablet Take 1 tablet by mouth every 4 (four) hours as needed for severe pain. 02/11/19   Bethel Born, PA-C      Allergies    Patient has no known allergies.    Review of Systems   Review of Systems  Physical Exam Updated Vital Signs BP 119/78 (BP Location: Right Arm)    Pulse 91    Temp 98.5 F (36.9 C) (Oral)    Resp 18    SpO2 100%  Physical Exam Vitals and nursing note reviewed.  Constitutional:      Appearance: He is well-developed.  HENT:     Head: Normocephalic and atraumatic.  Eyes:     Pupils: Pupils are equal, round, and reactive to light.  Neck:     Vascular: No JVD.  Cardiovascular:     Rate and  Rhythm: Normal rate and regular rhythm.     Heart sounds: No murmur heard.   No friction rub. No gallop.  Pulmonary:     Effort: No respiratory distress.     Breath sounds: No wheezing.  Abdominal:     General: There is no distension.     Tenderness: There is no abdominal tenderness. There is no guarding or rebound.  Musculoskeletal:        General: Normal range of motion.     Cervical back: Normal range of motion and neck supple.     Comments: Pain along the medial aspect of the [facility] on the right with a painful vein.  No area of fluctuance or induration.  Mild warmth to the area.  Full range of motion of the elbow.  Skin:    Coloration: Skin is not pale.     Findings: No rash.  Neurological:     Mental Status: He is alert and oriented to person, place, and time.  Psychiatric:        Behavior: Behavior normal.    ED Results / Procedures / Treatments   Labs (all labs ordered are listed, but only abnormal results are  displayed) Labs Reviewed - No data to display  EKG None  Radiology No results found.  Procedures Procedures    Medications Ordered in ED Medications  doxycycline (VIBRA-TABS) tablet 100 mg (has no administration in time range)    ED Course/ Medical Decision Making/ A&P                           Medical Decision Making Risk Prescription drug management.   31 yo M with a chief complaints of pain to a vein in his arm.  Most likely the patient has thrombophlebitis.  No obvious signs of infection.  I will start him on doxycycline as the patient does endorse using IV drugs.  Given him information to obtain clean needles consult peer support placed.  We will have him follow-up with the family doctor.  2:25 AM:  I have discussed the diagnosis/risks/treatment options with the patient.  Evaluation and diagnostic testing in the emergency department does not suggest an emergent condition requiring admission or immediate intervention beyond what has been performed at  this time.  They will follow up with  PCP. We also discussed returning to the ED immediately if new or worsening sx occur. We discussed the sx which are most concerning (e.g., sudden worsening pain, fever, inability to tolerate by mouth) that necessitate immediate return. Medications administered to the patient during their visit and any new prescriptions provided to the patient are listed below.  Medications given during this visit Medications  doxycycline (VIBRA-TABS) tablet 100 mg (has no administration in time range)     The patient appears reasonably screen and/or stabilized for discharge and I doubt any other medical condition or other [facility] requiring further screening, evaluation, or treatment in the ED at this time prior to discharge.          Final Clinical Impression(s) / ED Diagnoses Final diagnoses:  Thrombophlebitis    Rx / DC Orders ED Discharge Orders          Ordered    doxycycline (VIBRAMYCIN) 100 MG capsule  2 times daily        10/03/21 0220              Melene Plan, DO 10/03/21 0225

## 2022-06-22 IMAGING — DX DG CHEST 2V
2 series · 2 of 2 positions shown · non-contrast
Comparison: 02/10/2019

CLINICAL DATA: Shortness of breath for 3 days.  Insomnia.

EXAM:
CHEST - 2 VIEW

[w chest pa]
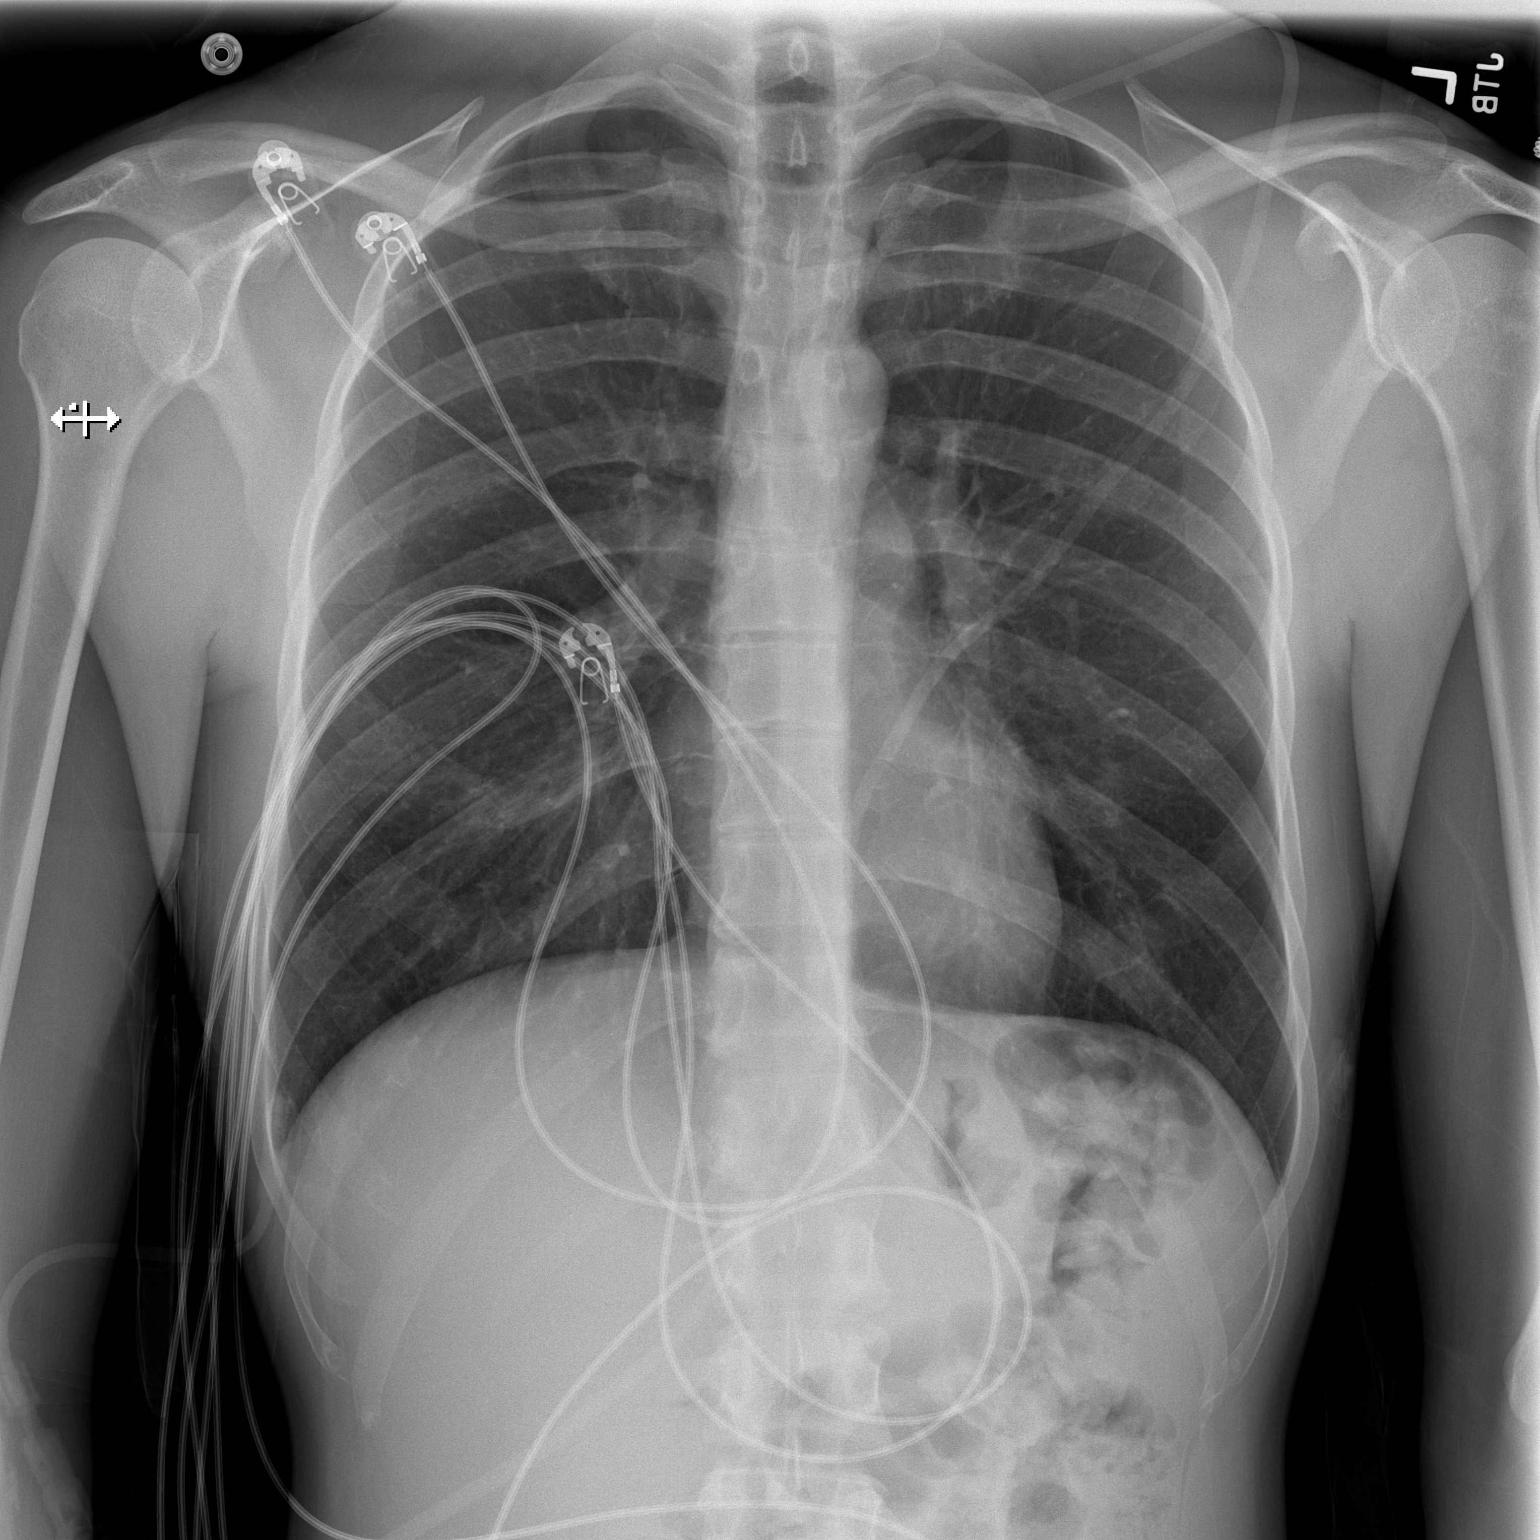

[w chest lat]
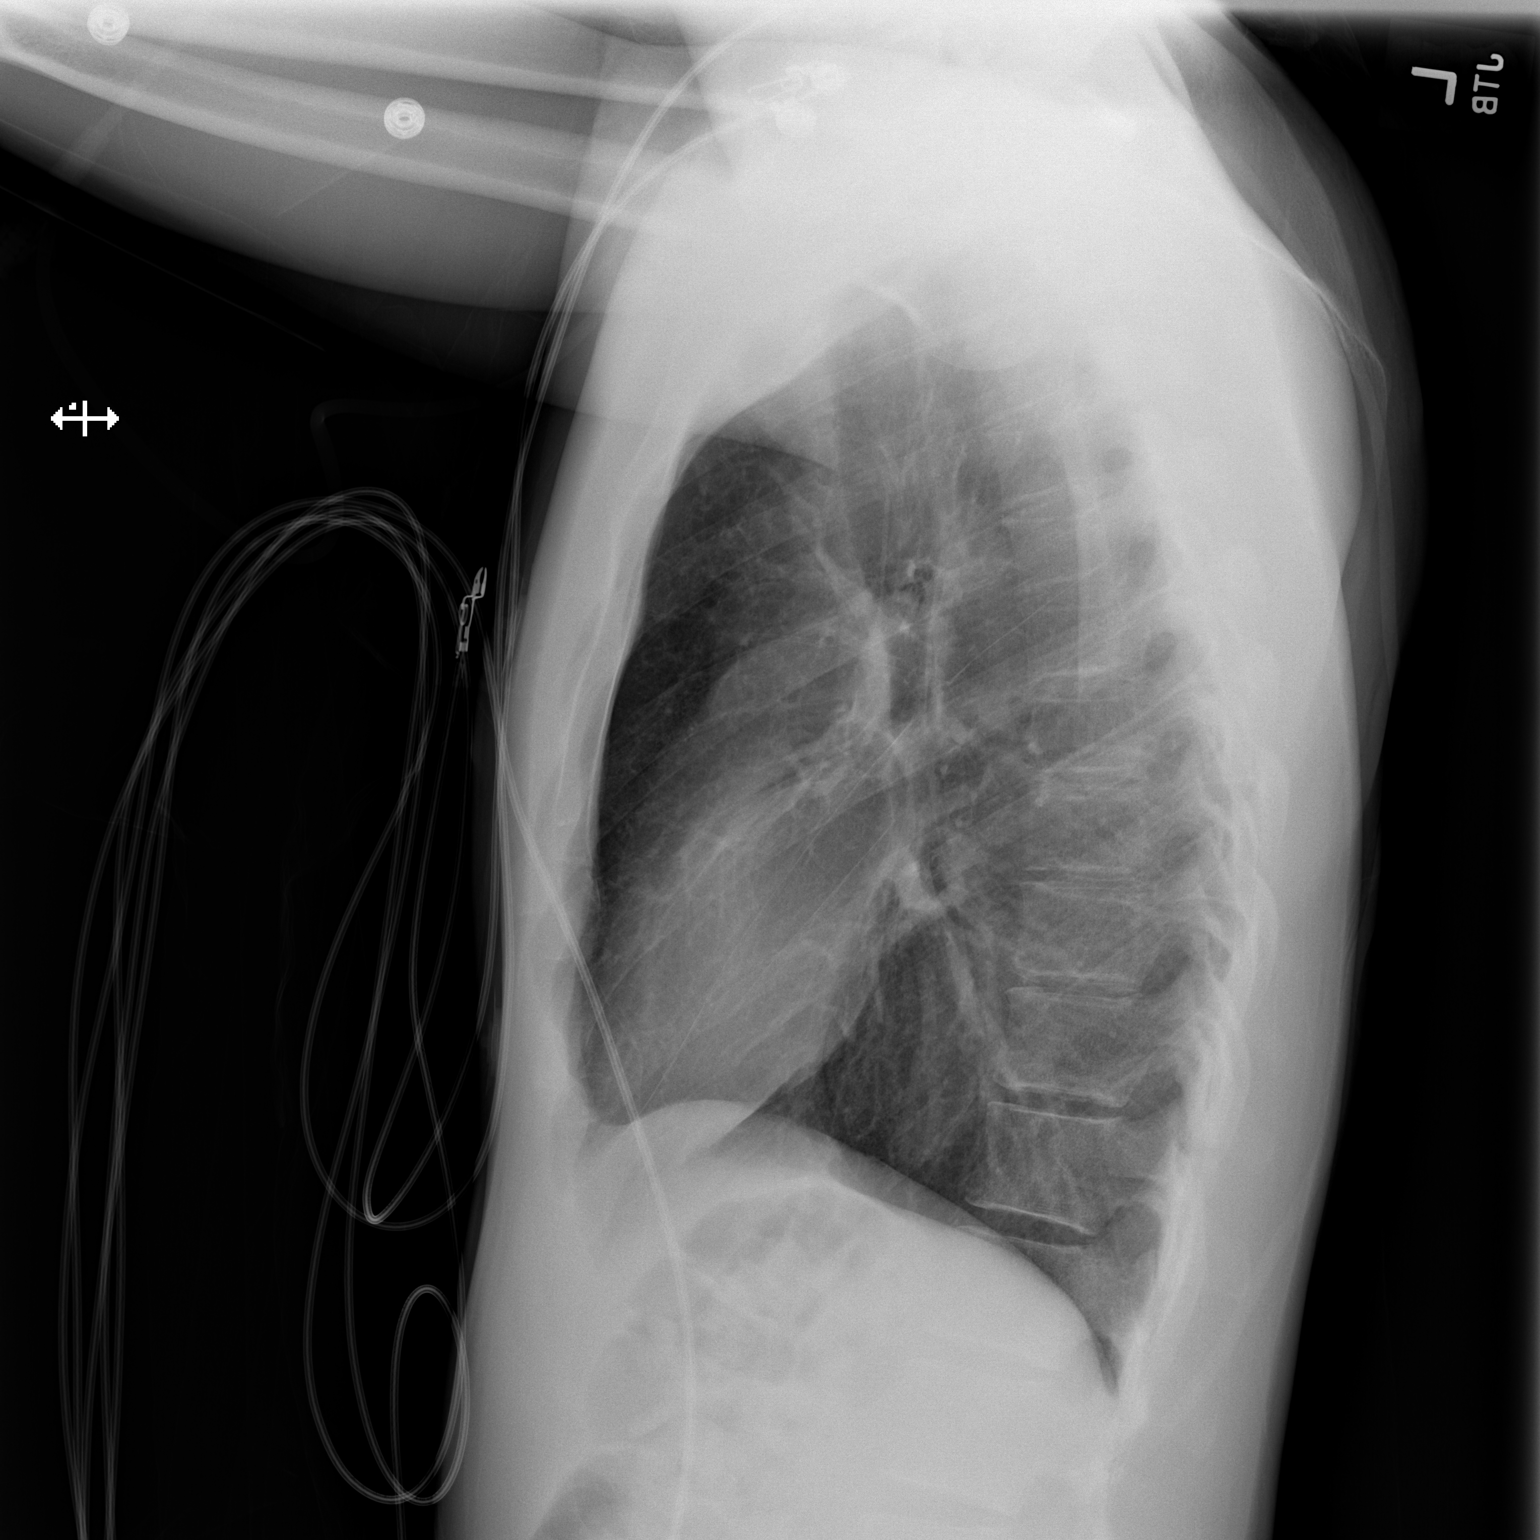

[2 of 2 positions shown; findings below may reference images not displayed]

FINDINGS: The heart size and mediastinal contours are within normal limits.
Both lungs are clear. The visualized skeletal structures are
unremarkable.
IMPRESSION: Normal exam.
